# Patient Record
Sex: Female | Born: 1995 | Race: White | Hispanic: No | Marital: Single | Smoking: Never smoker
Health system: Southern US, Community
[De-identification: ages and names within clinical notes are randomized; demographics above are authoritative.]

---

## 2017-06-22 ENCOUNTER — Emergency Department (HOSPITAL_COMMUNITY): Payer: BLUE CROSS/BLUE SHIELD

## 2017-06-22 ENCOUNTER — Emergency Department (HOSPITAL_COMMUNITY)
Admission: EM | Admit: 2017-06-22 | Discharge: 2017-06-22 | Disposition: A | Discharge status: Home / Self Care | Payer: BLUE CROSS/BLUE SHIELD | Attending: Emergency Medicine | Admitting: Emergency Medicine

## 2017-06-22 ENCOUNTER — Encounter (HOSPITAL_COMMUNITY): Payer: Self-pay

## 2017-06-22 DIAGNOSIS — R1032 Left lower quadrant pain: Secondary | ICD-10-CM | POA: Diagnosis not present

## 2017-06-22 DIAGNOSIS — R350 Frequency of micturition: Secondary | ICD-10-CM | POA: Diagnosis present

## 2017-06-22 DIAGNOSIS — N390 Urinary tract infection, site not specified: Secondary | ICD-10-CM | POA: Insufficient documentation

## 2017-06-22 DIAGNOSIS — N200 Calculus of kidney: Secondary | ICD-10-CM | POA: Diagnosis not present

## 2017-06-22 LAB — CBC
HCT: 40.5 % (ref 36.0–46.0)
Hemoglobin: 14.5 g/dL (ref 12.0–15.0)
MCH: 31.7 pg (ref 26.0–34.0)
MCHC: 35.8 g/dL (ref 30.0–36.0)
MCV: 88.4 fL (ref 78.0–100.0)
Platelets: 260 10*3/uL (ref 150–400)
RBC: 4.58 MIL/uL (ref 3.87–5.11)
RDW: 12.7 % (ref 11.5–15.5)
WBC: 9.2 10*3/uL (ref 4.0–10.5)

## 2017-06-22 LAB — URINALYSIS, ROUTINE W REFLEX MICROSCOPIC
Bacteria, UA: NONE SEEN
Bilirubin Urine: NEGATIVE
Glucose, UA: NEGATIVE mg/dL
Hgb urine dipstick: NEGATIVE
Ketones, ur: NEGATIVE mg/dL
Leukocytes, UA: NEGATIVE
Nitrite: NEGATIVE
Protein, ur: NEGATIVE mg/dL
RBC / HPF: NONE SEEN RBC/hpf (ref 0–5)
Specific Gravity, Urine: 1.02 (ref 1.005–1.030)
WBC, UA: NONE SEEN WBC/hpf (ref 0–5)
pH: 5 (ref 5.0–8.0)

## 2017-06-22 LAB — BASIC METABOLIC PANEL
Anion gap: 12 (ref 5–15)
BUN: 10 mg/dL (ref 6–20)
CO2: 21 mmol/L — ABNORMAL LOW (ref 22–32)
Calcium: 9.2 mg/dL (ref 8.9–10.3)
Chloride: 104 mmol/L (ref 101–111)
Creatinine, Ser: 1.42 mg/dL — ABNORMAL HIGH (ref 0.44–1.00)
GFR calc Af Amer: >60 mL/min (ref >60)
GFR calc non Af Amer: 52 mL/min — ABNORMAL LOW (ref >60)
Glucose, Bld: 107 mg/dL — ABNORMAL HIGH (ref 65–99)
Potassium: 3.4 mmol/L — ABNORMAL LOW (ref 3.5–5.1)
Sodium: 137 mmol/L (ref 135–145)

## 2017-06-22 LAB — POC URINE PREG, ED: Preg Test, Ur: NEGATIVE

## 2017-06-22 MED ORDER — HYDROMORPHONE HCL 1 MG/ML IJ SOLN
1.0000 mg | Freq: Once | INTRAMUSCULAR | Status: AC
  Administered 2017-06-22: 1 mg via INTRAMUSCULAR
  Filled 2017-06-22: qty 1

## 2017-06-22 MED ORDER — KETOROLAC TROMETHAMINE 15 MG/ML IJ SOLN
15.0000 mg | Freq: Once | INTRAMUSCULAR | Status: AC
  Administered 2017-06-22: 15 mg via INTRAMUSCULAR
  Filled 2017-06-22: qty 1

## 2017-06-22 MED ORDER — HYDROCODONE-ACETAMINOPHEN 5-325 MG PO TABS: 1.0000 | ORAL_TABLET | ORAL | 0 refills | Status: AC | PRN

## 2017-06-22 MED ORDER — ONDANSETRON HCL 4 MG PO TABS: 4.0000 mg | ORAL_TABLET | Freq: Four times a day (QID) | ORAL | 0 refills | Status: AC

## 2017-06-22 MED ORDER — ONDANSETRON 4 MG PO TBDP
4.0000 mg | ORAL_TABLET | Freq: Once | ORAL | Status: AC
  Administered 2017-06-22: 4 mg via ORAL
  Filled 2017-06-22: qty 1

## 2017-06-22 NOTE — ED Notes (Signed)
Pt states she understands instructions.  Home Stable with steady gait.With friend

## 2017-06-22 NOTE — ED Notes (Signed)
Patient transported to CT 

## 2017-06-22 NOTE — ED Triage Notes (Signed)
Per Pt, Pt was seen at [facility] for lower back pain and abdominal pain along with urinary frequency. Pt was diagnosed with UTI and sent home with antiobiotics. Pt reports the pain has increased and she has already started taking antibiotics. Denies vaginal discharge or bleeding.

## 2017-06-22 NOTE — ED Notes (Signed)
ED Provider at bedside. 

## 2017-07-10 NOTE — ED Provider Notes (Signed)
[facility]-EMERGENCY DEPT Provider Note   CSN: [id number] Arrival date & time: 06/22/17  1478     History   Chief Complaint Chief Complaint  Patient presents with  . Urinary Frequency    HPI Lauren Kramer is a 21 y.o. female.  HPI   21 year old female flank pain. Acute onset in L flank. Seen in UC and diagnsoed with UTI and placed on Abx. Coming to ED because persistent and poorly controlled symptoms. PAin is sharp.No appreciable exacerbating relieving factors. Rates into her groin. No specific urinary complaints. No unusual vag bleeding or discharge.   History reviewed. No pertinent past medical history.  There are no active problems to display for this patient.   History reviewed. No pertinent surgical history.  OB History    No data available       Home Medications    Prior to Admission medications   Medication Sig Start Date End Date Taking? Authorizing Provider  HYDROcodone-acetaminophen (NORCO/VICODIN) 5-325 MG tablet Take 1-2 tablets by mouth every 4 (four) hours as needed. 06/22/17   Raeford Razor, MD  ondansetron (ZOFRAN) 4 MG tablet Take 1 tablet (4 mg total) by mouth every 6 (six) hours. 06/22/17   Raeford Razor, MD    Family History No family history on file.  Social History Social History  Substance Use Topics  . Smoking status: Never Smoker  . Smokeless tobacco: Never Used  . Alcohol use No     Allergies   Patient has no known allergies.   Review of Systems Review of Systems  All systems reviewed and negative, other than as noted in HPI.   Physical Exam Updated Vital Signs BP 132/79 (BP Location: Right Arm)   Pulse 72   Temp 97.7 F (36.5 C) (Oral)   Resp 18   Ht  (1.651 m)   Wt 61.2 kg (135 lb)   LMP 06/12/2017   SpO2 98%   BMI 22.47 kg/m   Physical Exam  Constitutional: She appears well-developed and well-nourished. No distress.  HENT:  Head: Normocephalic and atraumatic.  Eyes: Conjunctivae are normal. Right eye exhibits  no discharge. Left eye exhibits no discharge.  Neck: Neck supple.  Cardiovascular: Normal rate, regular rhythm and normal heart sounds.  Exam reveals no gallop and no friction rub.   No murmur heard. Pulmonary/Chest: Effort normal and breath sounds normal. No respiratory distress.  Abdominal: Soft. She exhibits no distension. There is no tenderness.  Musculoskeletal: She exhibits no edema or tenderness.  Neurological: She is alert.  Skin: Skin is warm and dry.  Psychiatric: She has a normal mood and affect. Her behavior is normal. Thought content normal.  Nursing note and vitals reviewed.    ED Treatments / Results  Labs (all labs ordered are listed, but only abnormal results are displayed) Labs Reviewed  URINALYSIS, ROUTINE W REFLEX MICROSCOPIC - Abnormal; Notable for the following:       Result Value   Squamous Epithelial / LPF 0-5 (*)    All other components within normal limits  BASIC METABOLIC PANEL - Abnormal; Notable for the following:    Potassium 3.4 (*)    CO2 21 (*)    Glucose, Bld 107 (*)    Creatinine, Ser 1.42 (*)    GFR calc non Af Amer 52 (*)    All other components within normal limits  CBC  POC URINE PREG, ED    EKG  EKG Interpretation None       Radiology No results found.  Ct Abdomen Pelvis Wo Contrast  Result Date: 06/22/2017 CLINICAL DATA:  Patient with history of left flank pain. EXAM: CT ABDOMEN AND PELVIS WITHOUT CONTRAST TECHNIQUE: Multidetector CT imaging of the abdomen and pelvis was performed following the standard protocol without IV contrast. COMPARISON:  None. FINDINGS: Lower chest: Normal heart size. There is a 3 mm left lower lobe pulmonary nodule. Hepatobiliary: Liver is normal in size and contour. Gallbladder is unremarkable. No intrahepatic or extrahepatic biliary ductal dilatation. Pancreas: Unremarkable Spleen: Unremarkable Adrenals/Urinary Tract: Normal adrenal glands. There is moderate left hydroureteronephrosis to the distal left  ureter were there is an obstructing 3 mm stone (image 75; series 3). The left kidney is enlarged and edematous. Additional bilateral renal stones are demonstrated throughout the right and left kidneys measuring up to 4 mm within the interpolar region of the right kidney and 3 mm within the inferior pole of the left kidney. Urinary bladder is unremarkable. Stomach/Bowel: No abnormal bowel wall thickening or evidence for bowel obstruction. Normal morphology of the stomach. No free fluid or free intraperitoneal air. Normal appendix. Vascular/Lymphatic: Normal caliber abdominal aorta. No retroperitoneal lymphadenopathy. Reproductive: Uterus and adnexal structures are unremarkable. Other: None. Musculoskeletal: No aggressive or acute appearing osseous lesions. IMPRESSION: There is an obstructing 3 mm stone within the distal left ureter at the UVJ resulting in moderate left hydroureteronephrosis. Additional bilateral nephrolithiasis. Electronically Signed   By: Annia Belt M.D.   On: 06/22/2017 12:23    Procedures Procedures (including critical care time)  Medications Ordered in ED Medications  HYDROmorphone (DILAUDID) injection 1 mg (1 mg Intramuscular Given 06/22/17 1126)  ketorolac (TORADOL) 15 MG/ML injection 15 mg (15 mg Intramuscular Given 06/22/17 1122)  ondansetron (ZOFRAN-ODT) disintegrating tablet 4 mg (4 mg Oral Given 06/22/17 1120)     Initial Impression / Assessment and Plan / ED Course  I have reviewed the triage vital signs and the nursing notes.  Pertinent labs & imaging results that were available during my care of the patient were reviewed by me and considered in my medical decision making (see chart for details).     21 year old female with flank pain and recently diagnosed UTI. Symptoms more consistent with ureteral colic. CT done and showing 3mm distal ureteral stone. Symptoms tolerable with meds. Afebrile. HD stable. Symptomatic tx. It has been determined that no acute conditions  requiring further emergency intervention are present at this time. The patient has been advised of the diagnosis and plan. I reviewed any labs and imaging including any potential incidental findings. We have discussed signs and symptoms that warrant return to the ED and they are listed in the discharge instructions.    Final Clinical Impressions(s) / ED Diagnoses   Final diagnoses:  Kidney stone    New Prescriptions Discharge Medication List as of 06/22/2017 12:37 PM    START taking these medications   Details  HYDROcodone-acetaminophen (NORCO/VICODIN) 5-325 MG tablet Take 1-2 tablets by mouth every 4 (four) hours as needed., Starting Sat 06/22/2017, Print    ondansetron (ZOFRAN) 4 MG tablet Take 1 tablet (4 mg total) by mouth every 6 (six) hours., Starting Sat 06/22/2017, Print         Raeford Razor, MD 07/10/17 1330

## 2018-08-22 IMAGING — CT CT ABD-PELV W/O CM
2 of 4 series · 16 of 46 positions shown, 18 images · non-contrast
Comparison: None.

CLINICAL DATA: Patient with history of left flank pain.

EXAM:
CT ABDOMEN AND PELVIS WITHOUT CONTRAST
TECHNIQUE: Multidetector CT imaging of the abdomen and pelvis was performed
following the standard protocol without IV contrast.

[Series 3: ap without · axial · non-contrast · 0.75mm/px · z∈[+525,+950]mm · 13 of 95 slices shown, 15 images]
[im 5/95  soft-tissue]
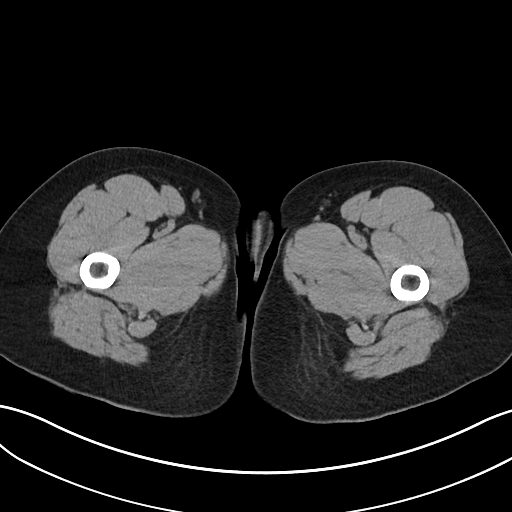
[im 5/95  bone]
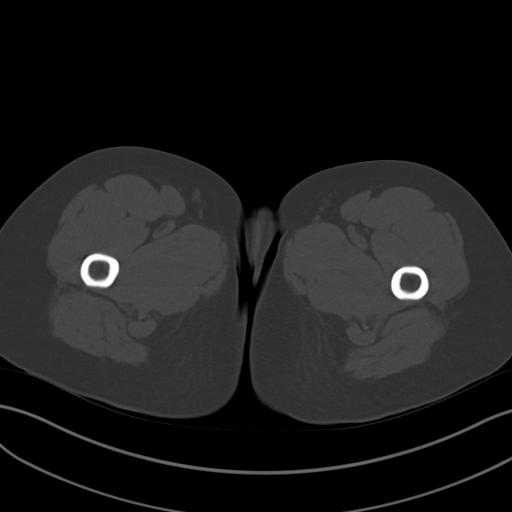
[im 15/95  soft-tissue]
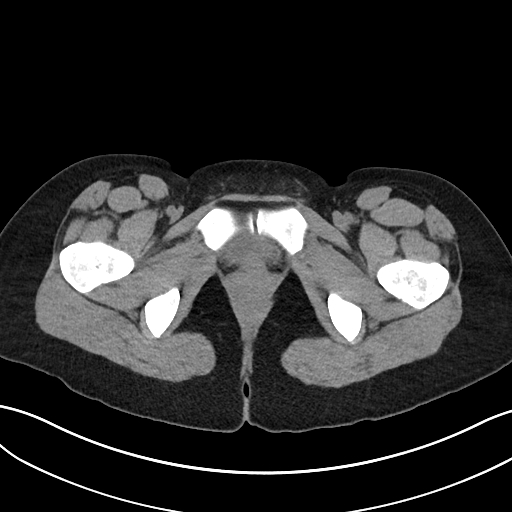
[im 20/95  soft-tissue]
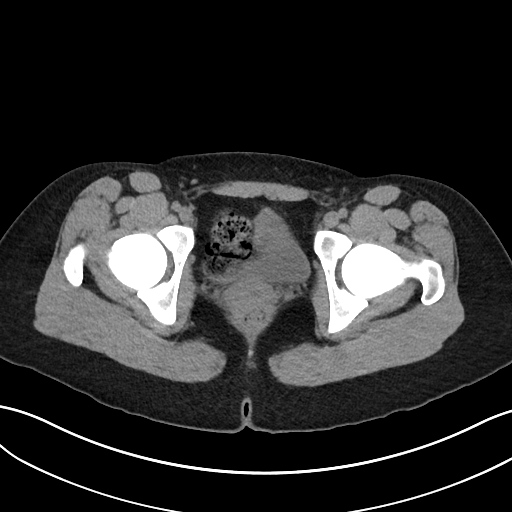
[im 25/95  soft-tissue]
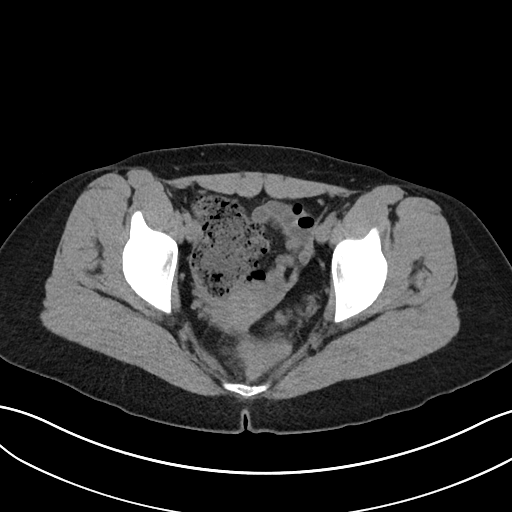
[im 35/95  soft-tissue]
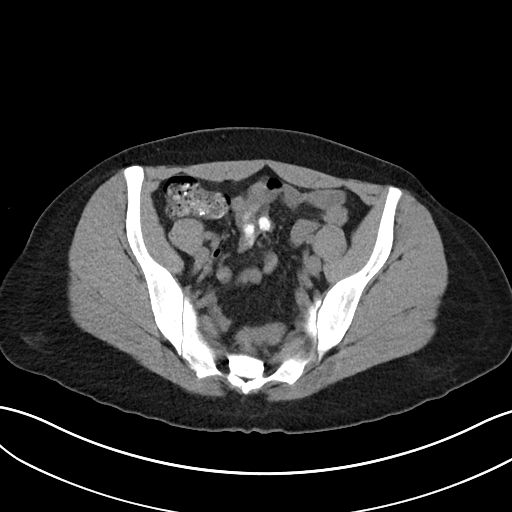
[im 40/95  soft-tissue]
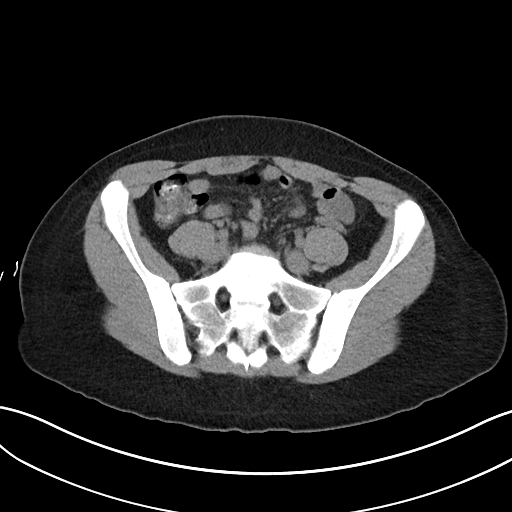
[im 50/95  soft-tissue]
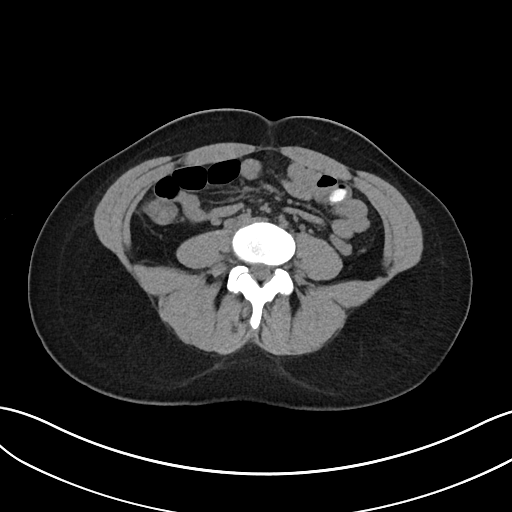
[im 55/95  soft-tissue]
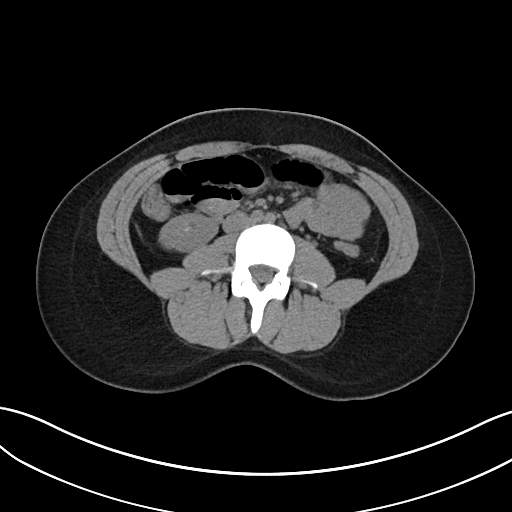
[im 60/95  soft-tissue]
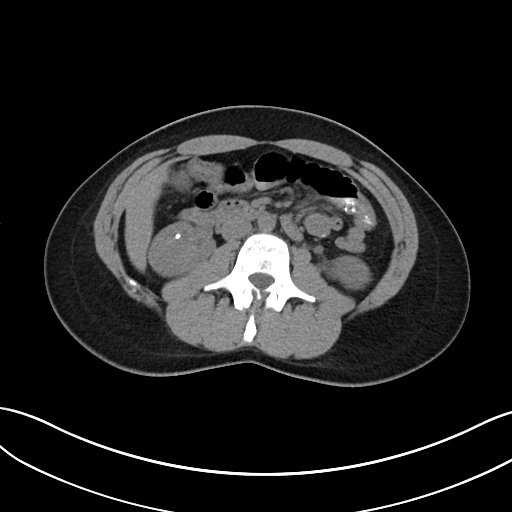
[im 60/95  bone]
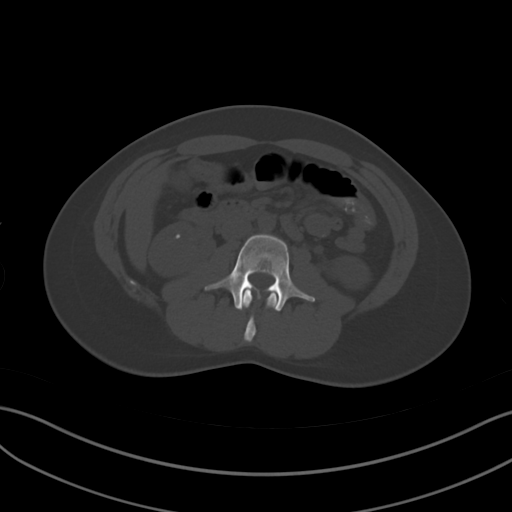
[im 70/95  soft-tissue]
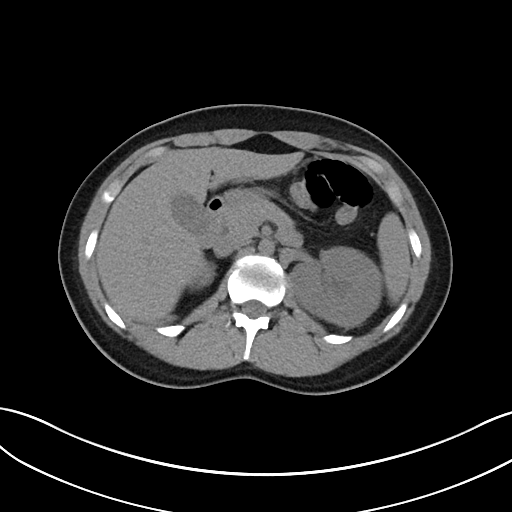
[im 75/95  soft-tissue]
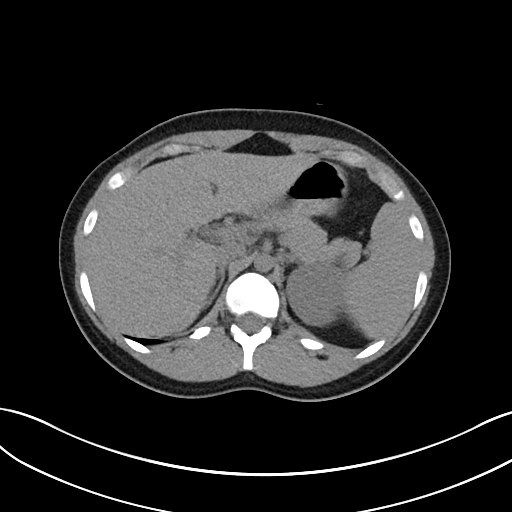
[im 80/95  soft-tissue]
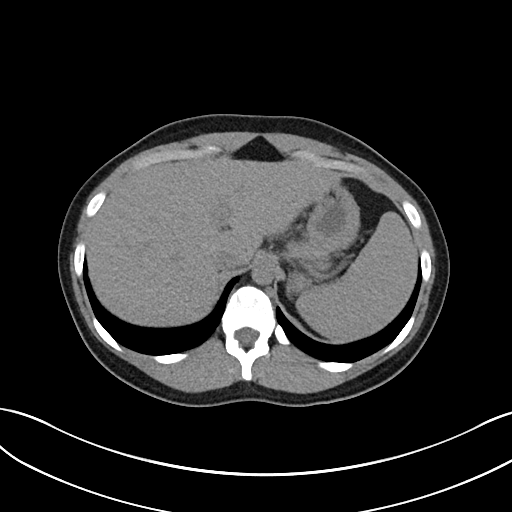
[im 90/95  soft-tissue]
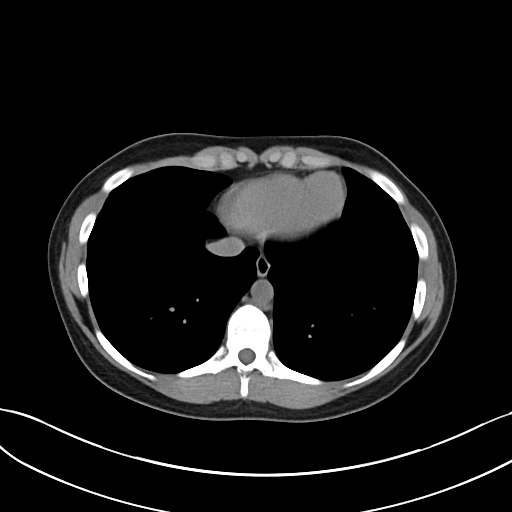

[Series 6: cor · coronal · 0.63mm/px · 3 of 84 slices shown]
[im 28/84  soft-tissue]
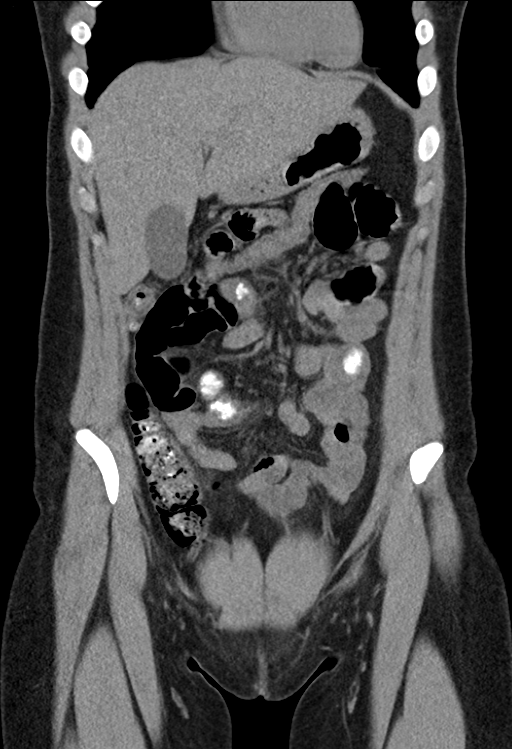
[im 37/84  soft-tissue]
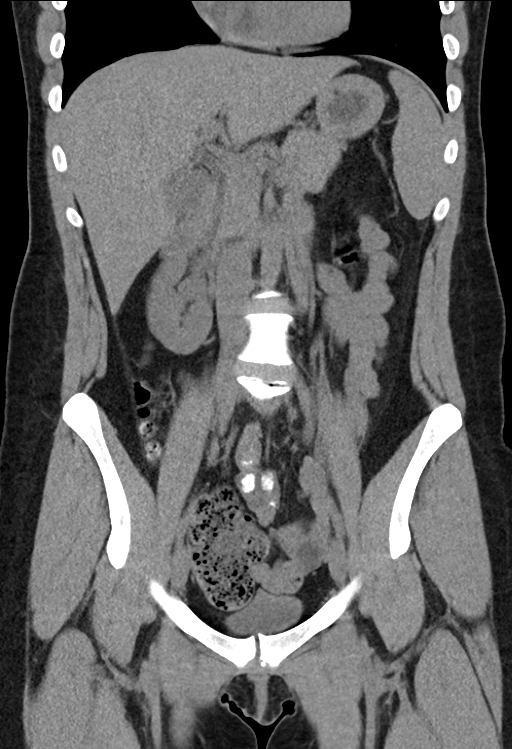
[im 47/84  soft-tissue]
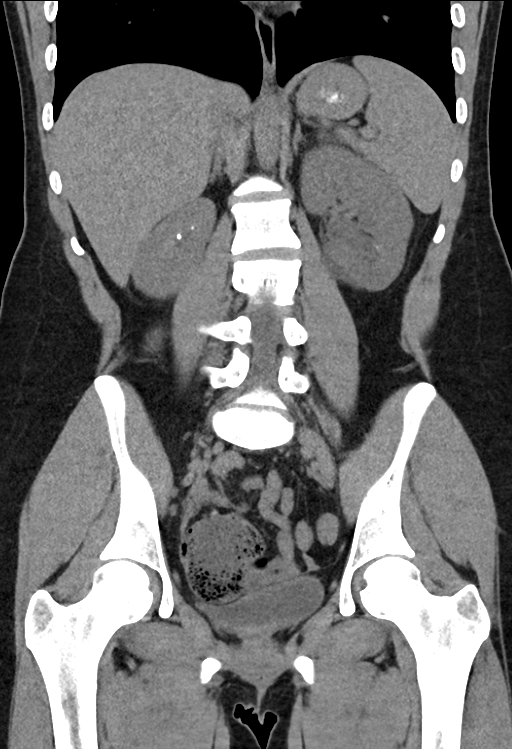

[16 of 46 positions shown; findings below may reference images not displayed]

FINDINGS: Lower chest: Normal heart size. There is a 3 mm left lower lobe
pulmonary nodule.

Hepatobiliary: Liver is normal in size and contour. Gallbladder is
unremarkable. No intrahepatic or extrahepatic biliary ductal
dilatation.

Pancreas: Unremarkable

Spleen: Unremarkable

Adrenals/Urinary Tract: Normal adrenal glands. There is moderate
left hydroureteronephrosis to the distal left ureter were there is
an obstructing 3 mm stone (image 75; series 3). The left kidney is
enlarged and edematous. Additional bilateral renal stones are
demonstrated throughout the right and left kidneys measuring up to 4
mm within the interpolar region of the right kidney and 3 mm within
the inferior pole of the left kidney. Urinary bladder is
unremarkable.

Stomach/Bowel: No abnormal bowel wall thickening or evidence for
bowel obstruction. Normal morphology of the stomach. No free fluid
or free intraperitoneal air. Normal appendix.

Vascular/Lymphatic: Normal caliber abdominal aorta. No
retroperitoneal lymphadenopathy.

Reproductive: Uterus and adnexal structures are unremarkable.

Other: None.

Musculoskeletal: No aggressive or acute appearing osseous lesions.
IMPRESSION: There is an obstructing 3 mm stone within the distal left ureter at
the UVJ resulting in moderate left hydroureteronephrosis.

Additional bilateral nephrolithiasis.
